# Patient Record
Sex: Female | Born: 1970
Health system: Southern US, Community
[De-identification: ages and names within clinical notes are randomized; demographics above are authoritative.]

## PROBLEM LIST (undated history)

## (undated) HISTORY — PX: CERVICAL ABLATION: SHX5771

## (undated) HISTORY — PX: TUBAL LIGATION: SHX77

---

## 1998-08-30 ENCOUNTER — Encounter: Payer: Self-pay | Admitting: Emergency Medicine

## 1998-08-30 ENCOUNTER — Emergency Department (HOSPITAL_COMMUNITY): Admission: EM | Admit: 1998-08-30 | Discharge: 1998-08-30 | Payer: Self-pay | Admitting: Emergency Medicine

## 2019-05-19 ENCOUNTER — Other Ambulatory Visit: Payer: Self-pay

## 2019-05-19 ENCOUNTER — Ambulatory Visit: Payer: Self-pay | Attending: Internal Medicine

## 2019-05-19 DIAGNOSIS — U071 COVID-19: Secondary | ICD-10-CM | POA: Insufficient documentation

## 2019-05-19 DIAGNOSIS — Z20822 Contact with and (suspected) exposure to covid-19: Secondary | ICD-10-CM

## 2019-05-21 LAB — NOVEL CORONAVIRUS, NAA: SARS-CoV-2, NAA: DETECTED — AB

## 2019-07-17 ENCOUNTER — Ambulatory Visit: Payer: Self-pay

## 2020-06-22 ENCOUNTER — Other Ambulatory Visit (HOSPITAL_COMMUNITY): Payer: Self-pay | Admitting: *Deleted

## 2020-06-22 DIAGNOSIS — N643 Galactorrhea not associated with childbirth: Secondary | ICD-10-CM

## 2020-07-24 ENCOUNTER — Inpatient Hospital Stay (HOSPITAL_COMMUNITY): Admission: RE | Admit: 2020-07-24 | Payer: Self-pay | Source: Ambulatory Visit

## 2020-07-24 ENCOUNTER — Ambulatory Visit (HOSPITAL_COMMUNITY): Payer: Self-pay

## 2020-08-21 ENCOUNTER — Ambulatory Visit (HOSPITAL_COMMUNITY): Payer: PRIVATE HEALTH INSURANCE

## 2020-08-21 ENCOUNTER — Encounter (HOSPITAL_COMMUNITY): Payer: Self-pay

## 2020-08-21 ENCOUNTER — Ambulatory Visit (HOSPITAL_COMMUNITY): Admission: RE | Admit: 2020-08-21 | Payer: PRIVATE HEALTH INSURANCE | Source: Ambulatory Visit

## 2020-08-21 ENCOUNTER — Ambulatory Visit (HOSPITAL_COMMUNITY): Payer: Self-pay

## 2020-09-18 ENCOUNTER — Ambulatory Visit (HOSPITAL_COMMUNITY)
Admission: RE | Admit: 2020-09-18 | Discharge: 2020-09-18 | Disposition: A | Discharge status: Home / Self Care | Payer: PRIVATE HEALTH INSURANCE | Source: Ambulatory Visit | Attending: *Deleted | Admitting: *Deleted

## 2020-09-18 ENCOUNTER — Other Ambulatory Visit (HOSPITAL_COMMUNITY): Payer: PRIVATE HEALTH INSURANCE

## 2020-09-18 ENCOUNTER — Encounter (HOSPITAL_COMMUNITY): Payer: Self-pay

## 2020-09-18 DIAGNOSIS — N643 Galactorrhea not associated with childbirth: Secondary | ICD-10-CM

## 2020-12-12 ENCOUNTER — Ambulatory Visit: Payer: Self-pay | Admitting: Physician Assistant

## 2020-12-13 ENCOUNTER — Encounter: Payer: Self-pay | Admitting: Physician Assistant

## 2020-12-19 ENCOUNTER — Encounter: Payer: Self-pay | Admitting: Physician Assistant

## 2020-12-19 ENCOUNTER — Other Ambulatory Visit: Payer: Self-pay | Admitting: Physician Assistant

## 2020-12-19 ENCOUNTER — Other Ambulatory Visit: Payer: Self-pay

## 2020-12-19 ENCOUNTER — Ambulatory Visit: Payer: Self-pay | Admitting: Physician Assistant

## 2020-12-19 VITALS — BP 152/90 | HR 82 | Temp 98.2°F | Ht 61.5 in | Wt 161.0 lb

## 2020-12-19 DIAGNOSIS — F17200 Nicotine dependence, unspecified, uncomplicated: Secondary | ICD-10-CM | POA: Insufficient documentation

## 2020-12-19 DIAGNOSIS — Z7689 Persons encountering health services in other specified circumstances: Secondary | ICD-10-CM

## 2020-12-19 DIAGNOSIS — Z131 Encounter for screening for diabetes mellitus: Secondary | ICD-10-CM

## 2020-12-19 DIAGNOSIS — I10 Essential (primary) hypertension: Secondary | ICD-10-CM | POA: Insufficient documentation

## 2020-12-19 DIAGNOSIS — Z1211 Encounter for screening for malignant neoplasm of colon: Secondary | ICD-10-CM

## 2020-12-19 DIAGNOSIS — Z13220 Encounter for screening for lipoid disorders: Secondary | ICD-10-CM

## 2020-12-19 DIAGNOSIS — Z87898 Personal history of other specified conditions: Secondary | ICD-10-CM

## 2020-12-19 DIAGNOSIS — G8929 Other chronic pain: Secondary | ICD-10-CM

## 2020-12-19 MED ORDER — LOSARTAN POTASSIUM 50 MG PO TABS
50.0000 mg | ORAL_TABLET | Freq: Every day | ORAL | 1 refills | Status: DC
Start: 2020-12-19 — End: 2021-01-01

## 2020-12-19 NOTE — Progress Notes (Signed)
BP (!) 152/90   Pulse 82   Temp 98.2 F (36.8 C)   Ht 5' 1.5" (1.562 m)   Wt 161 lb (73 kg)   SpO2 96%   BMI 29.93 kg/m    Subjective:    Patient ID: Wendy Brady, female    DOB: 28-Jun-1970, 50 y.o.   MRN: [id number]  HPI: Wendy Brady is a 51 y.o. female presenting on 12/19/2020 for No chief complaint on file.   HPI   Pt had a negative covid 19 screening questionnaire.  Pt is a 50yoF who presents to establish care.   Pt c/o problems with her feet for about 4 years.  She reports Pain and numbness.  And she feels like it's starting in her hands recently as well.  She feels like this is the only reason her bp is elevated.   She is still taking diclofenac and tramadol that she got in [place] 2 years ago to help with the pain.  She says she was not being treated by a pain specialist.  She "put herself on lyrica" several days ago that she got from a friend.   Pt says she thinks it's her nerves causing the pain and she needs a CT to evaluate it.   She was a very heavy drinker until about 2017.   She has an appointment with a podiatrist next week.  She does not work.   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   Bp elevated  today.  It was Also elevated in ER on 04/13/17 and 08/27/16.  No other readings available at this time.  Pt says she has never taken medication for her BP.       Relevant past medical, surgical, family and social history reviewed and updated as indicated. Interim medical history since our last visit reviewed. Allergies and medications reviewed and updated.   Current Outpatient Medications:    DICLOFENAC PO, Take by mouth., Disp: , Rfl:    MAGNESIUM PO, Take by mouth., Disp: , Rfl:    Multiple Vitamins-Minerals (ZINC PO), Take by mouth., Disp: , Rfl:    Pregabalin (LYRICA PO), Take by  mouth., Disp: , Rfl:    TRAMADOL HCL PO, Take by mouth., Disp: , Rfl:    VITAMIN D PO, Take by mouth., Disp: , Rfl:     Review of Systems  Per HPI unless specifically indicated above     Objective:    BP (!) 152/90   Pulse 82   Temp 98.2 F (36.8 C)   Ht 5' 1.5" (1.562 m)   Wt 161 lb (73 kg)   SpO2 96%   BMI 29.93 kg/m   Wt Readings from Last 3 Encounters:  12/19/20 161 lb (73 kg)    Physical Exam Vitals reviewed.  Constitutional:      General: She is not in acute distress.    Appearance: She is well-developed. She is not ill-appearing.  HENT:     Head: Normocephalic and atraumatic.     Right Ear: Tympanic membrane, ear canal and external ear normal.     Left Ear: Tympanic membrane, ear canal and external ear normal.  Eyes:     Extraocular Movements: Extraocular movements intact.     Conjunctiva/sclera: Conjunctivae normal.     Pupils: Pupils are equal, round,  and reactive to light.  Neck:     Thyroid: No thyromegaly.     Vascular: No carotid bruit.  Cardiovascular:     Rate and Rhythm: Normal rate and regular rhythm.  Pulmonary:     Effort: Pulmonary effort is normal.     Breath sounds: Normal breath sounds.  Abdominal:     General: Bowel sounds are normal.     Palpations: Abdomen is soft. There is no mass.     Tenderness: There is no abdominal tenderness.  Musculoskeletal:     Cervical back: Neck supple.     Right lower leg: No edema.     Left lower leg: No edema.  Lymphadenopathy:     Cervical: No cervical adenopathy.  Skin:    General: Skin is warm and dry.  Neurological:     Mental Status: She is alert and oriented to person, place, and time.     Cranial Nerves: No facial asymmetry.     Motor: No weakness, tremor, atrophy or abnormal muscle tone.     Gait: Gait normal.     Deep Tendon Reflexes:     Reflex Scores:      Patellar reflexes are 2+ on the right side and 2+ on the left side. Psychiatric:        Attention and Perception: Attention  normal.        Speech: Speech normal.        Behavior: Behavior normal.          Assessment & Plan:    Encounter Diagnoses  Name Primary?   Encounter to establish care Yes   Primary hypertension    Tobacco use disorder    History of heavy alcohol consumption    Other chronic pain    Screening for colon cancer    Screening cholesterol level    Screening for diabetes mellitus     -pt is given FIT test for colon cancer screening  -PAP/mammogram are UTD -will get Baseline labs --Records request sent to her previous PCP in [place] to see what evaluations have been done thus far for her pain -Rx losartan 50mg  for the bp -pt to follow up 4wk.  She is to contact office sooner prn

## 2020-12-20 ENCOUNTER — Other Ambulatory Visit (HOSPITAL_COMMUNITY)
Admission: RE | Admit: 2020-12-20 | Discharge: 2020-12-20 | Disposition: A | Discharge status: Home / Self Care | Payer: Self-pay | Source: Ambulatory Visit | Attending: Physician Assistant | Admitting: Physician Assistant

## 2020-12-20 DIAGNOSIS — I10 Essential (primary) hypertension: Secondary | ICD-10-CM

## 2020-12-20 DIAGNOSIS — Z87898 Personal history of other specified conditions: Secondary | ICD-10-CM

## 2020-12-20 DIAGNOSIS — G8929 Other chronic pain: Secondary | ICD-10-CM

## 2020-12-20 DIAGNOSIS — Z131 Encounter for screening for diabetes mellitus: Secondary | ICD-10-CM

## 2020-12-20 DIAGNOSIS — Z13220 Encounter for screening for lipoid disorders: Secondary | ICD-10-CM

## 2020-12-20 LAB — COMPREHENSIVE METABOLIC PANEL
ALT: 21 U/L (ref 0–44)
AST: 20 U/L (ref 15–41)
Albumin: 4.5 g/dL (ref 3.5–5.0)
Alkaline Phosphatase: 75 U/L (ref 38–126)
Anion gap: 6 (ref 5–15)
BUN: 10 mg/dL (ref 6–20)
CO2: 26 mmol/L (ref 22–32)
Calcium: 9.3 mg/dL (ref 8.9–10.3)
Chloride: 105 mmol/L (ref 98–111)
Creatinine, Ser: 0.74 mg/dL (ref 0.44–1.00)
GFR, Estimated: >60 mL/min (ref >60)
Glucose, Bld: 89 mg/dL (ref 70–99)
Potassium: 3.7 mmol/L (ref 3.5–5.1)
Sodium: 137 mmol/L (ref 135–145)
Total Bilirubin: 0.6 mg/dL (ref 0.3–1.2)
Total Protein: 7.6 g/dL (ref 6.5–8.1)

## 2020-12-20 LAB — HEMOGLOBIN A1C
Hgb A1c MFr Bld: 5.7 % — ABNORMAL HIGH (ref 4.8–5.6)
Mean Plasma Glucose: 116.89 mg/dL

## 2020-12-20 LAB — LIPID PANEL
Cholesterol: 333 mg/dL — ABNORMAL HIGH (ref 0–200)
HDL: 44 mg/dL (ref >40)
LDL Cholesterol: 241 mg/dL — ABNORMAL HIGH (ref 0–99)
Total CHOL/HDL Ratio: 7.6 RATIO
Triglycerides: 241 mg/dL — ABNORMAL HIGH (ref <150)
VLDL: 48 mg/dL — ABNORMAL HIGH (ref 0–40)

## 2020-12-20 LAB — IFOBT (OCCULT BLOOD): IFOBT: NEGATIVE

## 2020-12-20 LAB — TSH: TSH: 1.567 u[IU]/mL (ref 0.350–4.500)

## 2020-12-20 LAB — FOLATE: Folate: 14.4 ng/mL (ref >5.9)

## 2020-12-20 LAB — VITAMIN B12: Vitamin B-12: 235 pg/mL (ref 180–914)

## 2021-01-01 ENCOUNTER — Encounter: Payer: Self-pay | Admitting: Physician Assistant

## 2021-01-01 ENCOUNTER — Ambulatory Visit: Payer: Self-pay | Admitting: Physician Assistant

## 2021-01-01 VITALS — BP 119/82 | HR 86 | Temp 97.9°F | Wt 163.0 lb

## 2021-01-01 DIAGNOSIS — G8929 Other chronic pain: Secondary | ICD-10-CM

## 2021-01-01 DIAGNOSIS — E785 Hyperlipidemia, unspecified: Secondary | ICD-10-CM

## 2021-01-01 DIAGNOSIS — I10 Essential (primary) hypertension: Secondary | ICD-10-CM

## 2021-01-01 DIAGNOSIS — F17200 Nicotine dependence, unspecified, uncomplicated: Secondary | ICD-10-CM

## 2021-01-01 MED ORDER — ROSUVASTATIN CALCIUM 20 MG PO TABS: 20.0000 mg | ORAL_TABLET | Freq: Every day | ORAL | 4 refills | Status: DC

## 2021-01-01 MED ORDER — LOSARTAN POTASSIUM 50 MG PO TABS: 50.0000 mg | ORAL_TABLET | Freq: Every day | ORAL | 4 refills | Status: AC

## 2021-01-01 MED ORDER — LOSARTAN POTASSIUM 50 MG PO TABS: 50.0000 mg | ORAL_TABLET | Freq: Every day | ORAL | 4 refills | Status: DC

## 2021-01-01 NOTE — Patient Instructions (Signed)
High Cholesterol  High cholesterol is a condition in which the blood has high levels of a white, waxy substance similar to fat (cholesterol). The liver makes all the cholesterol that the body needs. The human body needs small amounts of cholesterol to help build cells. A person gets extra orexcess cholesterol from the food that he or she eats. The blood carries cholesterol from the liver to the rest of the body. If you have high cholesterol, deposits (plaques) may build up on the walls of your arteries. Arteries are the blood vessels that carry blood away from your heart. These plaques make the arteries narrowand stiff. Cholesterol plaques increase your risk for heart attack and stroke. Work withyour health care provider to keep your cholesterol levels in a healthy range. What increases the risk? The following factors may make you more likely to develop this condition: Eating foods that are high in animal fat (saturated fat) or cholesterol. Being overweight. Not getting enough exercise. A family history of high cholesterol (familial hypercholesterolemia). Use of tobacco products. Having diabetes. What are the signs or symptoms? There are no symptoms of this condition. How is this diagnosed? This condition may be diagnosed based on the results of a blood test. If you are older than 51 years of age, your health care provider may check your cholesterol levels every 4-6 years. You may be checked more often if you have high cholesterol or other risk factors for heart disease. The blood test for cholesterol measures: "Bad" cholesterol, or LDL cholesterol. This is the main type of cholesterol that causes heart disease. The desired level is less than 100 mg/dL. "Good" cholesterol, or HDL cholesterol. HDL helps protect against heart disease by cleaning the arteries and carrying the LDL to the liver for processing. The desired level for HDL is 60 mg/dL or higher. Triglycerides. These are fats that your  body can store or burn for energy. The desired level is less than 150 mg/dL. Total cholesterol. This measures the total amount of cholesterol in your blood and includes LDL, HDL, and triglycerides. The desired level is less than 200 mg/dL. How is this treated? This condition may be treated with: Diet changes. You may be asked to eat foods that have more fiber and less saturated fats or added sugar. Lifestyle changes. These may include regular exercise, maintaining a healthy weight, and quitting use of tobacco products. Medicines. These are given when diet and lifestyle changes have not worked. You may be prescribed a statin medicine to help lower your cholesterol levels. Follow these instructions at home: Eating and drinking  Eat a healthy, balanced diet. This diet includes: Daily servings of a variety of fresh, frozen, or canned fruits and vegetables. Daily servings of whole grain foods that are rich in fiber. Foods that are low in saturated fats and trans fats. These include poultry and fish without skin, lean cuts of meat, and low-fat dairy products. A variety of fish, especially oily fish that contain omega-3 fatty acids. Aim to eat fish at least 2 times a week. Avoid foods and drinks that have added sugar. Use healthy cooking methods, such as roasting, grilling, broiling, baking, poaching, steaming, and stir-frying. Do not fry your food except for stir-frying.  Lifestyle  Get regular exercise. Aim to exercise for a total of 150 minutes a week. Increase your activity level by doing activities such as gardening, walking, and taking the stairs. Do not use any products that contain nicotine or tobacco, such as cigarettes, e-cigarettes, and chewing tobacco.   If you need help quitting, ask your health care provider.  General instructions Take over-the-counter and prescription medicines only as told by your health care provider. Keep all follow-up visits as told by your health care provider.  This is important. Where to find more information American Heart Association: www.heart.org National Heart, Lung, and Blood Institute: www.nhlbi.nih.gov Contact a health care provider if: You have trouble achieving or maintaining a healthy diet or weight. You are starting an exercise program. You are unable to stop smoking. Get help right away if: You have chest pain. You have trouble breathing. You have any symptoms of a stroke. "BE FAST" is an easy way to remember the main warning signs of a stroke: B - Balance. Signs are dizziness, sudden trouble walking, or loss of balance. E - Eyes. Signs are trouble seeing or a sudden change in vision. F - Face. Signs are sudden weakness or numbness of the face, or the face or eyelid drooping on one side. A - Arms. Signs are weakness or numbness in an arm. This happens suddenly and usually on one side of the body. S - Speech. Signs are sudden trouble speaking, slurred speech, or trouble understanding what people say. T - Time. Time to call emergency services. Write down what time symptoms started. You have other signs of a stroke, such as: A sudden, severe headache with no known cause. Nausea or vomiting. Seizure. These symptoms may represent a serious problem that is an emergency. Do not wait to see if the symptoms will go away. Get medical help right away. Call your local emergency services (911 in the [place] Do not drive yourself to the hospital. Summary Cholesterol plaques increase your risk for heart attack and stroke. Work with your health care provider to keep your cholesterol levels in a healthy range. Eat a healthy, balanced diet, get regular exercise, and maintain a healthy weight. Do not use any products that contain nicotine or tobacco, such as cigarettes, e-cigarettes, and chewing tobacco. Get help right away if you have any symptoms of a stroke. This information is not intended to replace advice given to you by your health care  provider. Make sure you discuss any questions you have with your healthcare provider. Document Revised: 03/28/2019 Document Reviewed: 03/28/2019 Elsevier Patient Education  2022 Elsevier Inc.  

## 2021-01-01 NOTE — Progress Notes (Signed)
BP 119/82   Pulse 86   Temp 97.9 F (36.6 C)   Wt 163 lb (73.9 kg)   SpO2 97%   BMI 30.30 kg/m    Subjective:    Patient ID: Wendy Brady, female    DOB: [birth year], 50 y.o.   MRN: [id number]  HPI: Wendy Brady is a 50 y.o. female presenting on 01/01/2021 for Follow-up   HPI   Pt had a negative covid 19 screening questionnaire.   Pt is 50yoF who presented on 12/19/20 to establish care.  Her follow up appointment was moved up as she was eager to return to review results.  She says she hasn't felt exahausted since starting the htn medication.    The record request sent to her previous PCP on 12/19/20 has not brought any records at this time.  Pt says she cancelled her appointment with the podiatrist that she reported having at her previous OV.    Pt is still requesting rx for lyrica and tramadol for her nerve pain.  She has been getting these medications from a friend.  She says her pain is in her feet.    Pt has not yet gotten the covid vaccination.      Relevant past medical, surgical, family and social history reviewed and updated as indicated. Interim medical history since our last visit reviewed. Allergies and medications reviewed and updated.   Current Outpatient Medications:    DICLOFENAC PO, Take by mouth., Disp: , Rfl:    losartan (COZAAR) 50 MG tablet, Take 1 tablet (50 mg total) by mouth daily., Disp: 30 tablet, Rfl: 1   MAGNESIUM PO, Take by mouth., Disp: , Rfl:    Multiple Vitamins-Minerals (ZINC PO), Take by mouth., Disp: , Rfl:    Pregabalin (LYRICA PO), Take by mouth., Disp: , Rfl:    TRAMADOL HCL PO, Take by mouth., Disp: , Rfl:    VITAMIN D PO, Take by mouth., Disp: , Rfl:     Review of Systems  Per HPI unless specifically indicated above     Objective:    BP 119/82   Pulse 86   Temp 97.9 F (36.6 C)   Wt 163 lb (73.9 kg)   SpO2 97%   BMI 30.30 kg/m   Wt Readings from Last 3 Encounters:  01/01/21 163 lb (73.9 kg)   12/19/20 161 lb (73 kg)    Physical Exam Vitals reviewed.  Constitutional:      General: She is not in acute distress.    Appearance: She is well-developed. She is obese. She is not toxic-appearing.  HENT:     Head: Normocephalic and atraumatic.  Cardiovascular:     Rate and Rhythm: Normal rate and regular rhythm.  Pulmonary:     Effort: Pulmonary effort is normal.     Breath sounds: Normal breath sounds.  Musculoskeletal:     Cervical back: Neck supple.     Right lower leg: No edema.     Left lower leg: No edema.  Feet:     Right foot:     Skin integrity: Skin integrity normal.     Toenail Condition: Right toenails are normal.     Left foot:     Skin integrity: Skin integrity normal.     Toenail Condition: Left toenails are normal.  Lymphadenopathy:     Cervical: No cervical adenopathy.  Skin:    General: Skin is warm and dry.  Neurological:     Mental Status: She is alert  and oriented to person, place, and time.     Gait: Gait is intact.  Psychiatric:        Attention and Perception: Attention normal.        Speech: Speech normal.        Behavior: Behavior normal.         Results for orders placed or performed during the hospital encounter of 12/20/20  TSH  Result Value Ref Range   TSH 1.567 0.350 - 4.500 uIU/mL  Hemoglobin A1c  Result Value Ref Range   Hgb A1c MFr Bld 5.7 (H) 4.8 - 5.6 %   Mean Plasma Glucose 116.89 mg/dL  Comprehensive metabolic panel  Result Value Ref Range   Sodium 137 135 - 145 mmol/L   Potassium 3.7 3.5 - 5.1 mmol/L   Chloride 105 98 - 111 mmol/L   CO2 26 22 - 32 mmol/L   Glucose, Bld 89 70 - 99 mg/dL   BUN 10 6 - 20 mg/dL   Creatinine, Ser 5.88 0.44 - 1.00 mg/dL   Calcium 9.3 8.9 - 50.2 mg/dL   Total Protein 7.6 6.5 - 8.1 g/dL   Albumin 4.5 3.5 - 5.0 g/dL   AST 20 15 - 41 U/L   ALT 21 0 - 44 U/L   Alkaline Phosphatase 75 38 - 126 U/L   Total Bilirubin 0.6 0.3 - 1.2 mg/dL   GFR, Estimated >77 >41 mL/min   Anion gap 6 5 -  15  Lipid panel  Result Value Ref Range   Cholesterol 333 (H) 0 - 200 mg/dL   Triglycerides 287 (H) <150 mg/dL   HDL 44 >86 mg/dL   Total CHOL/HDL Ratio 7.6 RATIO   VLDL 48 (H) 0 - 40 mg/dL   LDL Cholesterol 767 (H) 0 - 99 mg/dL  Vitamin M09  Result Value Ref Range   Vitamin B-12 235 180 - 914 pg/mL  Folate, serum, performed at [facility] lab  Result Value Ref Range   Folate 14.4 >5.9 ng/mL      Assessment & Plan:    Encounter Diagnoses  Name Primary?   Primary hypertension Yes   Hyperlipidemia, unspecified hyperlipidemia type    Other chronic pain    Tobacco use disorder      -reviewed labs with pt  -pt to Continue losartan for HTN -pt reports that her cholesterol has been high siince she was in her 57s and she eats relatively lowfat diet.  will Start crestor for dsylipidemia.  She is given reading information on this  -Encouraged regular exercise -Resend record request to get records on pain evaluation -Encouraged smoking cessation -encouraged covid vaccination -pt to follow up 3 months to recheck lipids.  Pt to contact office sooner prn

## 2021-01-16 ENCOUNTER — Ambulatory Visit: Payer: Self-pay | Admitting: Physician Assistant

## 2021-02-20 ENCOUNTER — Telehealth: Payer: Self-pay

## 2021-02-20 ENCOUNTER — Other Ambulatory Visit: Payer: Self-pay | Admitting: Physician Assistant

## 2021-02-20 MED ORDER — ATORVASTATIN CALCIUM 20 MG PO TABS: 20.0000 mg | ORAL_TABLET | Freq: Every day | ORAL | 3 refills | Status: AC

## 2021-02-20 NOTE — Telephone Encounter (Signed)
Call from pt stating she is having pain & numbness after taking rosuvastatin, informed pt that she will have atorvastatin called in to her pharmacy.

## 2021-02-27 ENCOUNTER — Encounter (INDEPENDENT_AMBULATORY_CARE_PROVIDER_SITE_OTHER): Payer: Self-pay

## 2021-03-20 ENCOUNTER — Other Ambulatory Visit: Payer: Self-pay | Admitting: Physician Assistant

## 2021-03-20 DIAGNOSIS — E785 Hyperlipidemia, unspecified: Secondary | ICD-10-CM

## 2021-03-20 DIAGNOSIS — I10 Essential (primary) hypertension: Secondary | ICD-10-CM

## 2021-04-08 ENCOUNTER — Telehealth: Payer: Self-pay

## 2021-04-08 ENCOUNTER — Encounter: Payer: Self-pay | Admitting: Physician Assistant

## 2021-04-08 ENCOUNTER — Ambulatory Visit: Payer: Self-pay | Admitting: Physician Assistant

## 2021-04-08 DIAGNOSIS — I10 Essential (primary) hypertension: Secondary | ICD-10-CM

## 2021-04-08 DIAGNOSIS — F17200 Nicotine dependence, unspecified, uncomplicated: Secondary | ICD-10-CM

## 2021-04-08 DIAGNOSIS — E278 Other specified disorders of adrenal gland: Secondary | ICD-10-CM

## 2021-04-08 DIAGNOSIS — E785 Hyperlipidemia, unspecified: Secondary | ICD-10-CM

## 2021-04-08 NOTE — Progress Notes (Signed)
There were no vitals taken for this visit.   Subjective:    Patient ID: Wendy Brady, female    DOB: 03-18-1971, 50 y.o.   MRN: [id number]  HPI: Wendy Brady is a 50 y.o. female presenting on 04/08/2021 for No chief complaint on file.   HPI   This is a telemedicine appointment through [employer].  I connected with  Wendy Brady on 04/08/21 by a video enabled telemedicine application and verified that I am speaking with the correct person using two identifiers.   I discussed the limitations of evaluation and management by telemedicine. The patient expressed understanding and agreed to proceed.  Pt is at home.  Provider is at office.    Pt is 50yoF with htn and dyslipidemia.  She says her Bp has been good since starting the losartan.  She does not monitor it at home.    She is working at [employer]  She stopped the crestor and was sent rx for atorvastatin after pt called office with reports that crestor made her feel bad.  She has not yet started taking the atorvastatin.   She says she didn't get her labs drawn yet because she was out of town.     Relevant past medical, surgical, family and social history reviewed and updated as indicated. Interim medical history since our last visit reviewed. Allergies and medications reviewed and updated.   Current Outpatient Medications:    BIOTIN PO, Take by mouth., Disp: , Rfl:    losartan (COZAAR) 50 MG tablet, Take 1 tablet (50 mg total) by mouth daily., Disp: 30 tablet, Rfl: 4   MAGNESIUM PO, Take by mouth., Disp: , Rfl:    Multiple Vitamins-Minerals (ZINC PO), Take by mouth., Disp: , Rfl:    NON FORMULARY, Brain Awake Red, Disp: , Rfl:    Nutritional Supplements (DHEA PO), Take by mouth., Disp: , Rfl:    atorvastatin (LIPITOR) 20 MG tablet, Take 1 tablet (20 mg total) by mouth daily. (Patient not taking: Reported on 04/08/2021), Disp: 30 tablet, Rfl: 3   DICLOFENAC PO, Take by mouth. (Patient not  taking: Reported on 04/08/2021), Disp: , Rfl:    Pregabalin (LYRICA PO), Take by mouth., Disp: , Rfl:    rosuvastatin (CRESTOR) 20 MG tablet, Take 1 tablet (20 mg total) by mouth daily., Disp: 30 tablet, Rfl: 4   TRAMADOL HCL PO, Take by mouth., Disp: , Rfl:    VITAMIN D PO, Take by mouth., Disp: , Rfl:     Review of Systems  Per HPI unless specifically indicated above     Objective:    There were no vitals taken for this visit.  Wt Readings from Last 3 Encounters:  01/01/21 163 lb (73.9 kg)  12/19/20 161 lb (73 kg)    Physical Exam Constitutional:      General: She is not in acute distress.    Appearance: She is not toxic-appearing.  HENT:     Head: Normocephalic and atraumatic.  Pulmonary:     Effort: No respiratory distress.     Comments: Pt is talking in complete sentences without dyspnea.  Neurological:     Mental Status: She is alert and oriented to person, place, and time.  Psychiatric:        Attention and Perception: Attention normal.        Mood and Affect: Affect is not inappropriate.        Speech: Speech normal.    Pt was smoking  during appointment and was asked to extinguish it until appointment was completed.         Assessment & Plan:    Encounter Diagnoses  Name Primary?   Primary hypertension Yes   Hyperlipidemia, unspecified hyperlipidemia type    Tobacco use disorder    Adrenal mass (HCC)      -records from one office visit with previous PCP in [place] were received and report right adrenal mass but no imaging studies were received.  Discussed with pt that CT needs to be ordered to evaluate.  Will Mail [facility] [employer] financial assistance application / cafa to pt - pt to continue Losartan and lipitor.  Rx sent to [facility] -discussed with pt the importance of statin therapy for her lipids particularly in light of her HTN and smoking -encouraged smoking cessation -encouraged covid vaccination -pt inquired about vaccinations needed due to she  hopes to trael to [place] soon.  Discussed covid may or may not be required but is definitely encouraged.  No other vaccinations would be needed prior to travel to [place].     -pt was offered but declined to make appointment for flu shot at this office -pt to follow up for htn and dyslipidemia in 3 months.she is to contact office sooner prn

## 2021-04-08 NOTE — Telephone Encounter (Signed)
Called pt to inform of CT scheduled 05/14/2020 at 7:45am (arrive at 7:30),   (Pt must have labs done 4 wks before CT done & needs to pick up prep at that time, no eating or drinking 4hrs before CT to be done)  Left vm for pt to call back

## 2021-05-14 ENCOUNTER — Ambulatory Visit (HOSPITAL_COMMUNITY): Admission: RE | Admit: 2021-05-14 | Payer: Self-pay | Source: Ambulatory Visit

## 2021-06-18 ENCOUNTER — Encounter: Payer: Self-pay | Admitting: Physician Assistant

## 2021-07-15 ENCOUNTER — Ambulatory Visit: Payer: Self-pay | Admitting: Physician Assistant

## 2022-05-01 IMAGING — MG DIGITAL DIAGNOSTIC BILAT W/ TOMO W/ CAD
6 of 12 series · 6 of 36 positions shown · non-contrast
Comparison: Previous exams.

CLINICAL DATA: 50-year-old female with bilateral yellow nipple
discharge with forced expression only.

EXAM:
DIGITAL DIAGNOSTIC BILATERAL MAMMOGRAM WITH TOMOSYNTHESIS AND CAD
TECHNIQUE: Bilateral digital diagnostic mammography and breast tomosynthesis
was performed. The images were evaluated with computer-aided
detection.

[R MLO synth-2D]
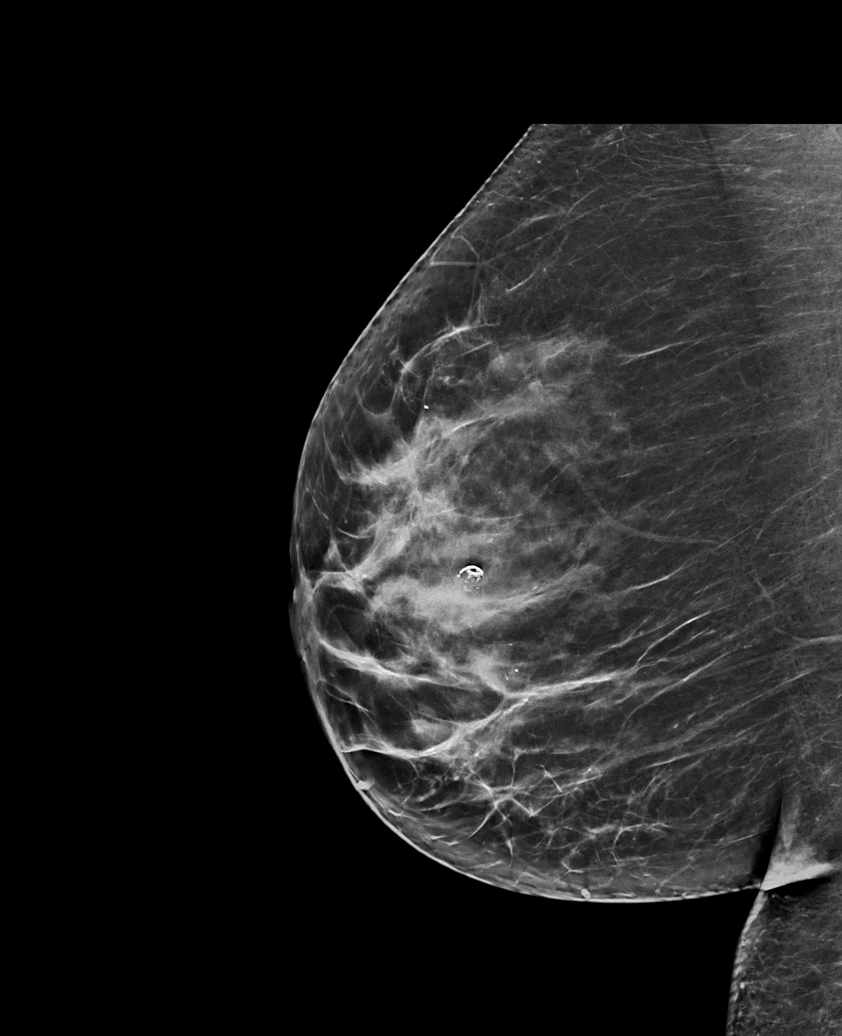

[R CC synth-2D (1 of 2)]
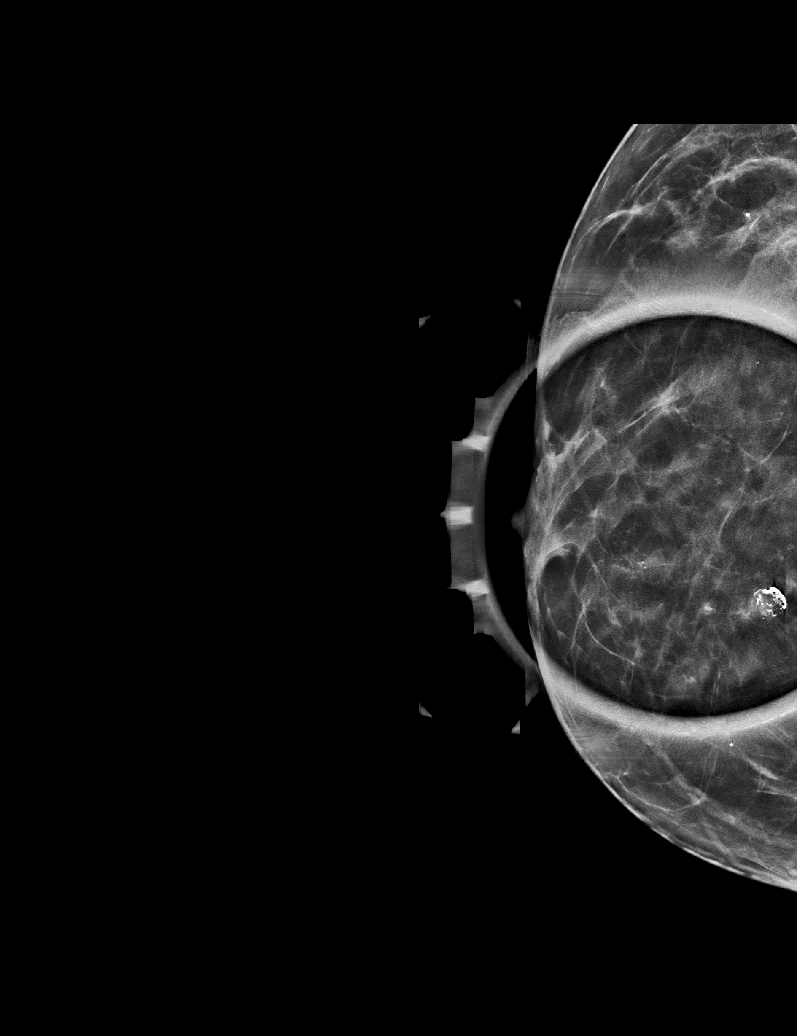

[L CC synth-2D (1 of 2)]
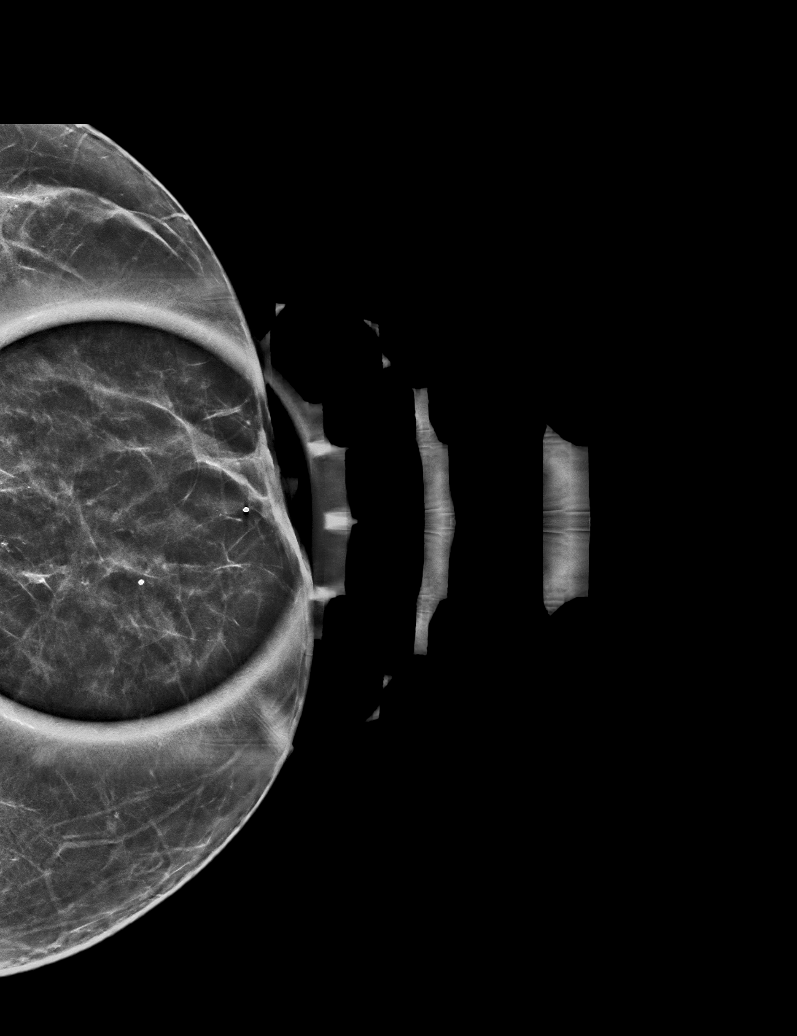

[L CC synth-2D (2 of 2)]
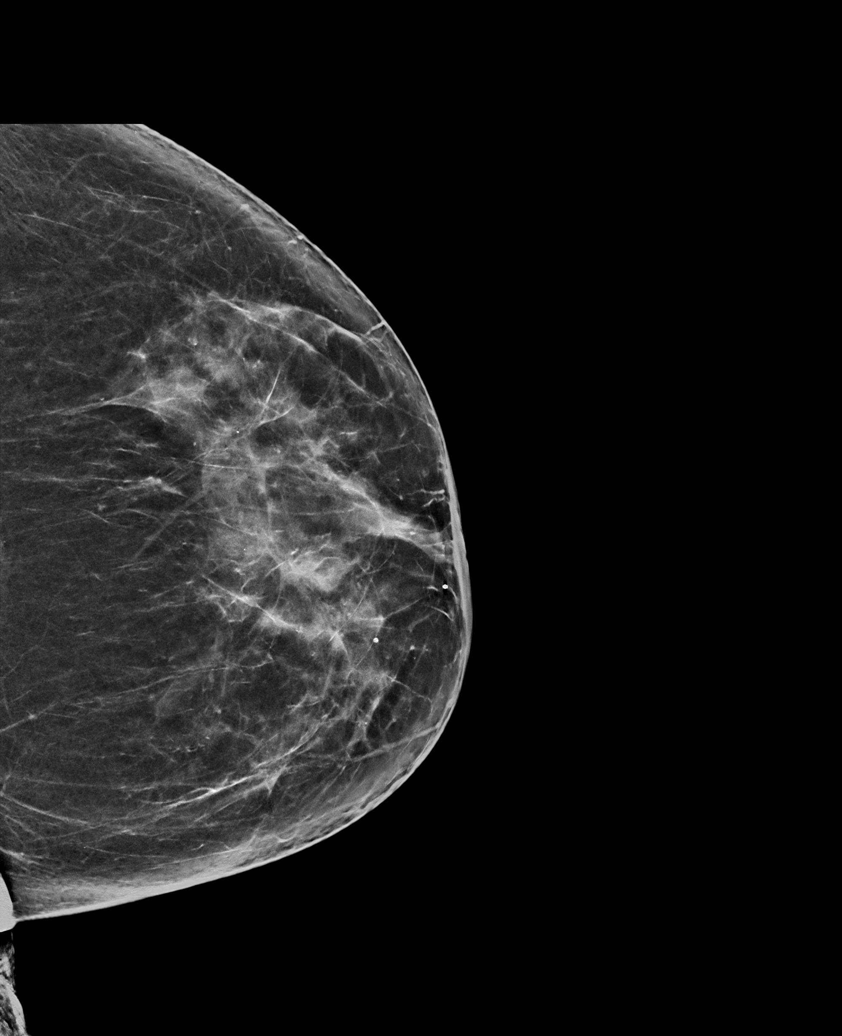

[R CC synth-2D (2 of 2)]
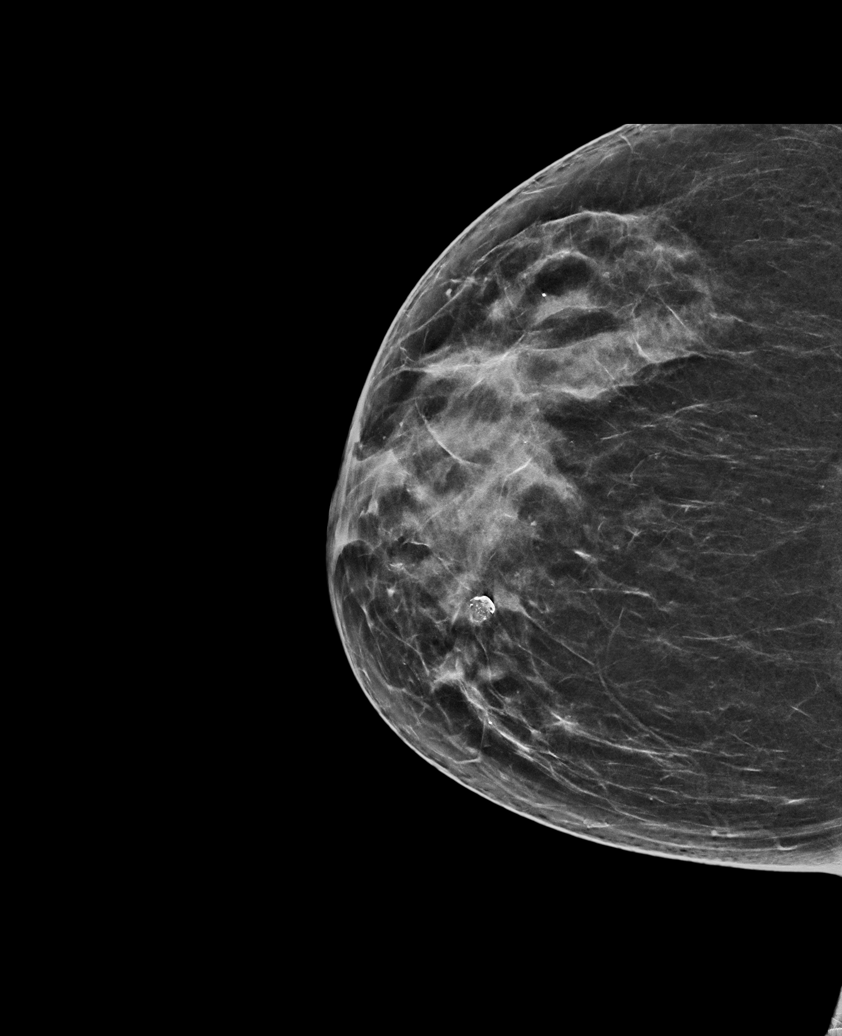

[L MLO synth-2D]
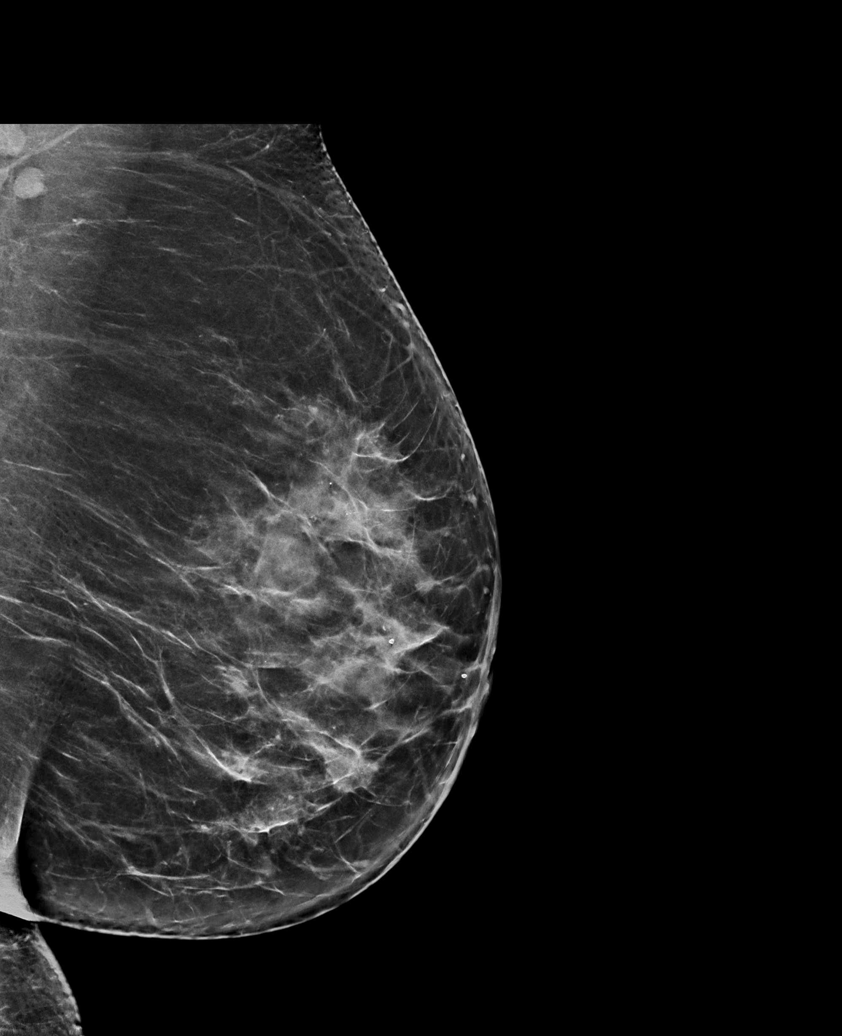

[6 of 36 positions shown; findings below may reference images not displayed]

ACR Breast Density Category c: The breast tissue is heterogeneously
dense, which may obscure small masses.
FINDINGS: No suspicious masses or calcifications are seen in either breast.
There is no mammographic evidence of malignancy in either breast.
IMPRESSION: No mammographic evidence of malignancy in either breast.

RECOMMENDATION:
1. Recommend further management of nipple discharge be based on
clinical assessment. This is felt to be physiologic/related to a
benign process given yellow color, bilaterality and non spontaneous
nature. If the patient experiences spontaneous unilateral bloody or
clear nipple discharge from a single duct only, then further
evaluation with breast MRI is recommended.

2.  Screening mammogram in one year.(Code:YQ-U-ZS0)

I have discussed the findings and recommendations with the patient.
If applicable, a reminder letter will be sent to the patient
regarding the next appointment.

BI-RADS CATEGORY  1: Negative.

## 2023-05-27 ENCOUNTER — Other Ambulatory Visit: Payer: Self-pay | Admitting: Medical Genetics

## 2023-05-28 ENCOUNTER — Other Ambulatory Visit (HOSPITAL_COMMUNITY): Payer: Self-pay | Attending: Medical Genetics

## 2024-03-07 ENCOUNTER — Other Ambulatory Visit: Payer: Self-pay | Admitting: Medical Genetics

## 2024-03-07 DIAGNOSIS — Z006 Encounter for examination for normal comparison and control in clinical research program: Secondary | ICD-10-CM
